# Patient Record
Sex: Female | Born: 1965 | Race: White | Hispanic: No | Smoking: Current every day smoker
Health system: Southern US, Community
[De-identification: ages and names within clinical notes are randomized; demographics above are authoritative.]

---

## 2001-08-09 ENCOUNTER — Encounter: Payer: Self-pay | Admitting: Internal Medicine

## 2001-08-09 ENCOUNTER — Ambulatory Visit (HOSPITAL_COMMUNITY): Admission: RE | Admit: 2001-08-09 | Discharge: 2001-08-09 | Payer: Self-pay | Admitting: Internal Medicine

## 2014-05-24 ENCOUNTER — Emergency Department (HOSPITAL_COMMUNITY): Payer: Managed Care, Other (non HMO)

## 2014-05-24 ENCOUNTER — Emergency Department (HOSPITAL_COMMUNITY)
Admission: EM | Admit: 2014-05-24 | Discharge: 2014-05-24 | Disposition: A | Discharge status: Home / Self Care | Payer: Managed Care, Other (non HMO) | Attending: Emergency Medicine | Admitting: Emergency Medicine

## 2014-05-24 ENCOUNTER — Encounter (HOSPITAL_COMMUNITY): Payer: Self-pay | Admitting: Emergency Medicine

## 2014-05-24 DIAGNOSIS — M542 Cervicalgia: Secondary | ICD-10-CM | POA: Insufficient documentation

## 2014-05-24 DIAGNOSIS — M5412 Radiculopathy, cervical region: Secondary | ICD-10-CM | POA: Insufficient documentation

## 2014-05-24 DIAGNOSIS — F411 Generalized anxiety disorder: Secondary | ICD-10-CM | POA: Insufficient documentation

## 2014-05-24 DIAGNOSIS — M25519 Pain in unspecified shoulder: Secondary | ICD-10-CM | POA: Insufficient documentation

## 2014-05-24 DIAGNOSIS — R000 Tachycardia, unspecified: Secondary | ICD-10-CM | POA: Insufficient documentation

## 2014-05-24 DIAGNOSIS — F17200 Nicotine dependence, unspecified, uncomplicated: Secondary | ICD-10-CM | POA: Insufficient documentation

## 2014-05-24 MED ORDER — IBUPROFEN 800 MG PO TABS
800.0000 mg | ORAL_TABLET | Freq: Once | ORAL | Status: AC
  Administered 2014-05-24: 800 mg via ORAL
  Filled 2014-05-24: qty 1

## 2014-05-24 MED ORDER — NAPROXEN 250 MG PO TABS
500.0000 mg | ORAL_TABLET | Freq: Once | ORAL | Status: AC
  Administered 2014-05-24: 500 mg via ORAL
  Filled 2014-05-24: qty 2

## 2014-05-24 MED ORDER — NAPROXEN SODIUM ER 500 MG PO TB24: 500.0000 mg | ORAL_TABLET | Freq: Every day | ORAL | Status: AC

## 2014-05-24 NOTE — ED Notes (Signed)
Pt here from home with c/o neck pain that  Radiates down the right arm , right arm has some numbness

## 2014-05-24 NOTE — ED Provider Notes (Signed)
CSN: [id number]     Arrival date & time 05/24/14  [id number] History   First MD Initiated Contact with Patient 05/24/14 [phone]     Chief Complaint  Patient presents with  . Neck Injury     (Consider location/radiation/quality/duration/timing/severity/associated sxs/prior Treatment) HPI Comments: Pt is a 48 y/o female who presents to the ED complaining of right shoulder pain radiating down her right arm with associated neck pain x 1 week, worsening over the past few days. Admits to associated tingling into her fingers. Pain worse with certain neck and shoulder movements. No alleviating factors. No known injury or trauma. States when she tries to hold things in her right hand it feels weaker than the left. Denies CP, sob, fever, chills.  Patient is a 48 y.o. female presenting with neck injury. The history is provided by the patient.  Neck Injury    History reviewed. No pertinent past medical history. History reviewed. No pertinent past surgical history. History reviewed. No pertinent family history. History  Substance Use Topics  . Smoking status: Current Every Day Smoker  . Smokeless tobacco: Not on file  . Alcohol Use: Yes   OB History   Grav Para Term Preterm Abortions TAB SAB Ect Mult Living                 Review of Systems  Musculoskeletal:       + right sided shoulder and neck pain.  Neurological:       + tingling sensation.  All other systems reviewed and are negative.     Allergies  Doxycycline  Home Medications   Prior to Admission medications   Medication Sig Start Date End Date Taking? Authorizing Provider  HYDROcodone-acetaminophen (NORCO/VICODIN) 5-325 MG per tablet Take 1 tablet by mouth once. For pain   Yes Historical Provider, MD  OVER THE COUNTER MEDICATION Take 1 tablet by mouth daily as needed (for indigestion). [facility] antiacid   Yes Historical Provider, MD  naproxen (NAPRELAN) 500 MG 24 hr tablet Take 1 tablet (500 mg total) by mouth daily with  breakfast. 05/24/14   Trevor Maceobyn M Albert, PA-C   BP 120/82  Pulse 112  Temp(Src) 98 F (36.7 C) (Oral)  Resp 18  SpO2 100% Physical Exam  Nursing note and vitals reviewed. Constitutional: She is oriented to person, place, and time. She appears well-developed and well-nourished. No distress.  HENT:  Head: Normocephalic and atraumatic.  Mouth/Throat: Oropharynx is clear and moist.  Eyes: Conjunctivae and EOM are normal. Pupils are equal, round, and reactive to light.  Neck: Normal range of motion. Neck supple. No spinous process tenderness and no muscular tenderness present.  Cardiovascular: Regular rhythm and normal heart sounds.   Tachy.  Pulmonary/Chest: Effort normal and breath sounds normal. No respiratory distress.  Musculoskeletal: She exhibits no edema.  TTP lower cervical spine and right sided paraspinal muscles. Full ROM, pain noted in all direction. TTP right trapezius. Right shoulder TTP throughout. No deformity or swelling. Full ROM, pain to right trapezius and right side of neck with flexion and abduction.  Neurological: She is alert and oriented to person, place, and time. She has normal strength.  Strength RUE 4/5 compared to 5/5 on right. Sensation intact. Normal gait.  Skin: Skin is warm and dry. No rash noted. She is not diaphoretic.  Psychiatric: Her behavior is normal.  Anxious.    ED Course  Procedures (including critical care time) Labs Review Labs Reviewed - No data to display  Imaging Review Dg Cervical  Spine Complete  05/24/2014   CLINICAL DATA:  Right neck pain, radiculopathy, neck stiffness  EXAM: CERVICAL SPINE  4+ VIEWS  COMPARISON:  None.  FINDINGS: There is no evidence of cervical spine fracture or prevertebral soft tissue swelling. Alignment is normal. Minor early degenerative change at C5-6 and C6-7. Preserved vertebral body heights and disc spaces. No focal kyphosis. Facets aligned.  IMPRESSION: No acute finding.   Electronically Signed   By: Ruel Favors M.D.   On: 05/24/2014 10:36     EKG Interpretation None      MDM   Final diagnoses:  Neck pain  Cervical radiculopathy   Pt presenting with reproducible neck pain and radicular symptoms. She is well appearing and in NAD. Afebrile. Tachycardic but appears anxious. Mild strength difference of RUE compared to LUE, otherwise no focal neuro deficits. No meningeal signs. D/c with NSAIDs, f/u with PCP when she returns home to [place]. She is in town visiting her family. Stable for d/c. Return precautions given. Patient states understanding of treatment care plan and is agreeable.  Trevor Mace, PA-C 05/24/14 1051

## 2014-05-24 NOTE — ED Notes (Signed)
Pt brought back to room by Molli HazardMatthew, EMT; myself and [place], RN present in room; pt undressed, in gown, on continuous pulse oximetry and blood pressure cuff

## 2014-05-24 NOTE — Discharge Instructions (Signed)
Take naproxen as directed for your pain. Follow up with your primary care doctor when you return home to [place].  Cervical Radiculopathy Cervical radiculopathy happens when a nerve in the neck is pinched or bruised by a slipped (herniated) disk or by arthritic changes in the bones of the cervical spine. This can occur due to an injury or as part of the normal aging process. Pressure on the cervical nerves can cause pain or numbness that runs from your neck all the way down into your arm and fingers. CAUSES  There are many possible causes, including:  Injury.  Muscle tightness in the neck from overuse.  Swollen, painful joints (arthritis).  Breakdown or degeneration in the bones and joints of the spine (spondylosis) due to aging.  Bone spurs that may develop near the cervical nerves. SYMPTOMS  Symptoms include pain, weakness, or numbness in the affected arm and hand. Pain can be severe or irritating. Symptoms may be worse when extending or turning the neck. DIAGNOSIS  Your caregiver will ask about your symptoms and do a physical exam. He or she may test your strength and reflexes. X-rays, CT scans, and MRI scans may be needed in cases of injury or if the symptoms do not go away after a period of time. Electromyography (EMG) or nerve conduction testing may be done to study how your nerves and muscles are working. TREATMENT  Your caregiver may recommend certain exercises to help relieve your symptoms. Cervical radiculopathy can, and often does, get better with time and treatment. If your problems continue, treatment options may include:  Wearing a soft collar for short periods of time.  Physical therapy to strengthen the neck muscles.  Medicines, such as nonsteroidal anti-inflammatory drugs (NSAIDs), oral corticosteroids, or spinal injections.  Surgery. Different types of surgery may be done depending on the cause of your problems. HOME CARE INSTRUCTIONS   Put ice on the affected  area.  Put ice in a plastic bag.  Place a towel between your skin and the bag.  Leave the ice on for 15-20 minutes, 03-04 times a day or as directed by your caregiver.  If ice does not help, you can try using heat. Take a warm shower or bath, or use a hot water bottle as directed by your caregiver.  You may try a gentle neck and shoulder massage.  Use a flat pillow when you sleep.  Only take over-the-counter or prescription medicines for pain, discomfort, or fever as directed by your caregiver.  If physical therapy was prescribed, follow your caregiver's directions.  If a soft collar was prescribed, use it as directed. SEEK IMMEDIATE MEDICAL CARE IF:   Your pain gets much worse and cannot be controlled with medicines.  You have weakness or numbness in your hand, arm, face, or leg.  You have a high fever or a stiff, rigid neck.  You lose bowel or bladder control (incontinence).  You have trouble with walking, balance, or speaking. MAKE SURE YOU:   Understand these instructions.  Will watch your condition.  Will get help right away if you are not doing well or get worse. Document Released: 07/13/2001 Document Revised: 01/10/2012 Document Reviewed: 06/01/2011 [facility] Patient Information 2015 [employer], [place]. This information is not intended to replace advice given to you by your health care provider. Make sure you discuss any questions you have with your health care provider.

## 2014-05-25 NOTE — ED Provider Notes (Signed)
Medical screening examination/treatment/procedure(s) were performed by non-physician practitioner and as supervising physician I was immediately available for consultation/collaboration.   EKG Interpretation None        Hind Chesler W Tatayana Beshears, MD 05/25/14 1537 

## 2015-10-02 IMAGING — CR DG CERVICAL SPINE COMPLETE 4+V
6 series · 6 of 6 positions shown · non-contrast
Comparison: None.

CLINICAL DATA: Right neck pain, radiculopathy, neck stiffness

EXAM:
CERVICAL SPINE  4+ VIEWS

[w cervical spine lat]
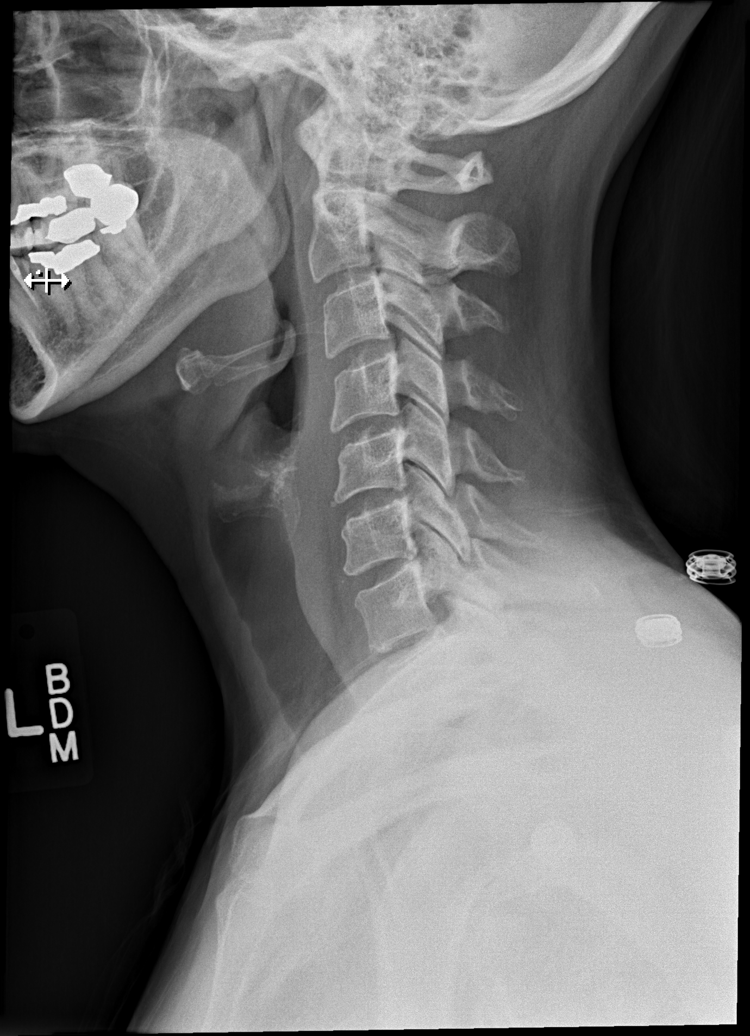

[w cervical spine ap_obl (1 of 2)]
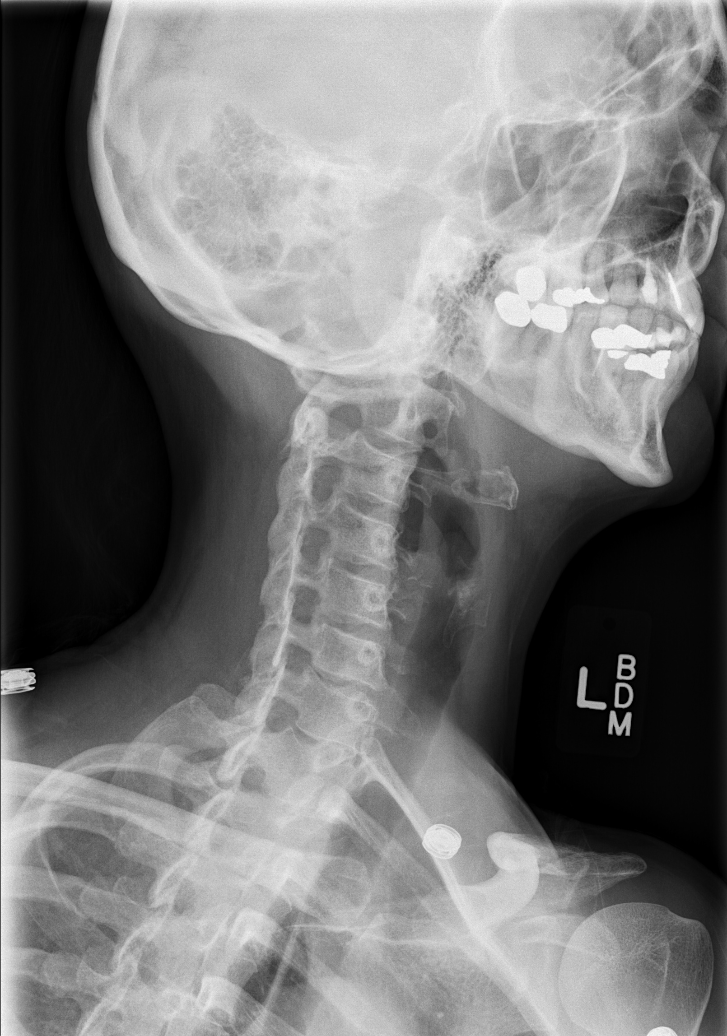

[w cervical spine ap_obl (2 of 2)]
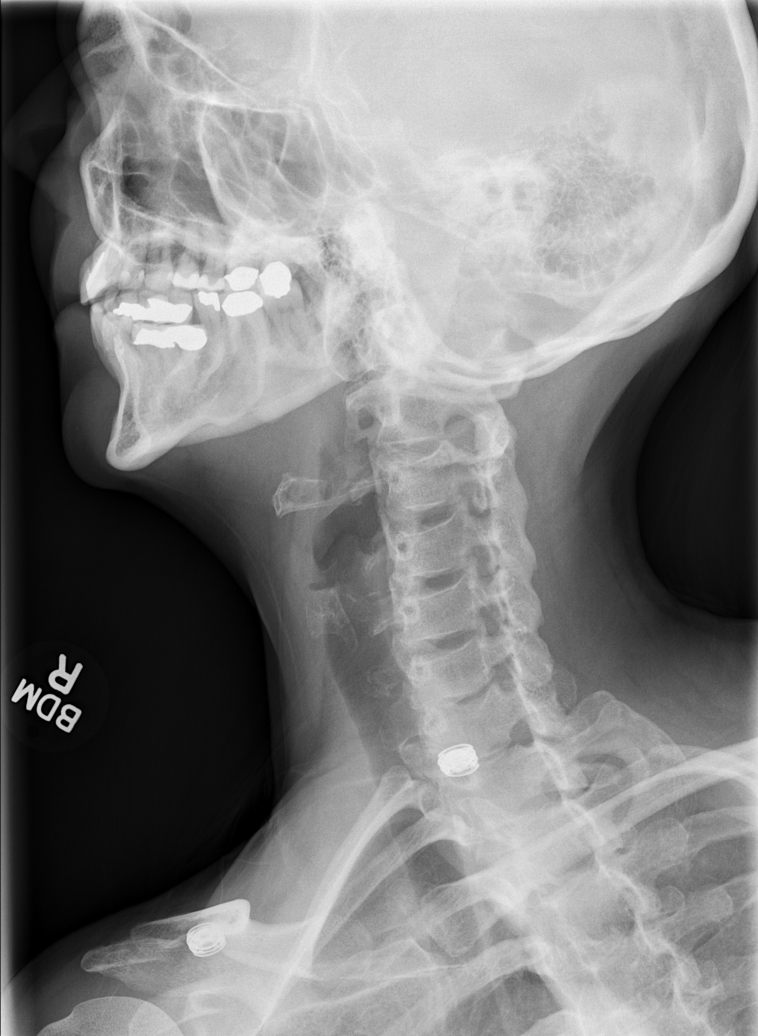

[w cervical spine ap]
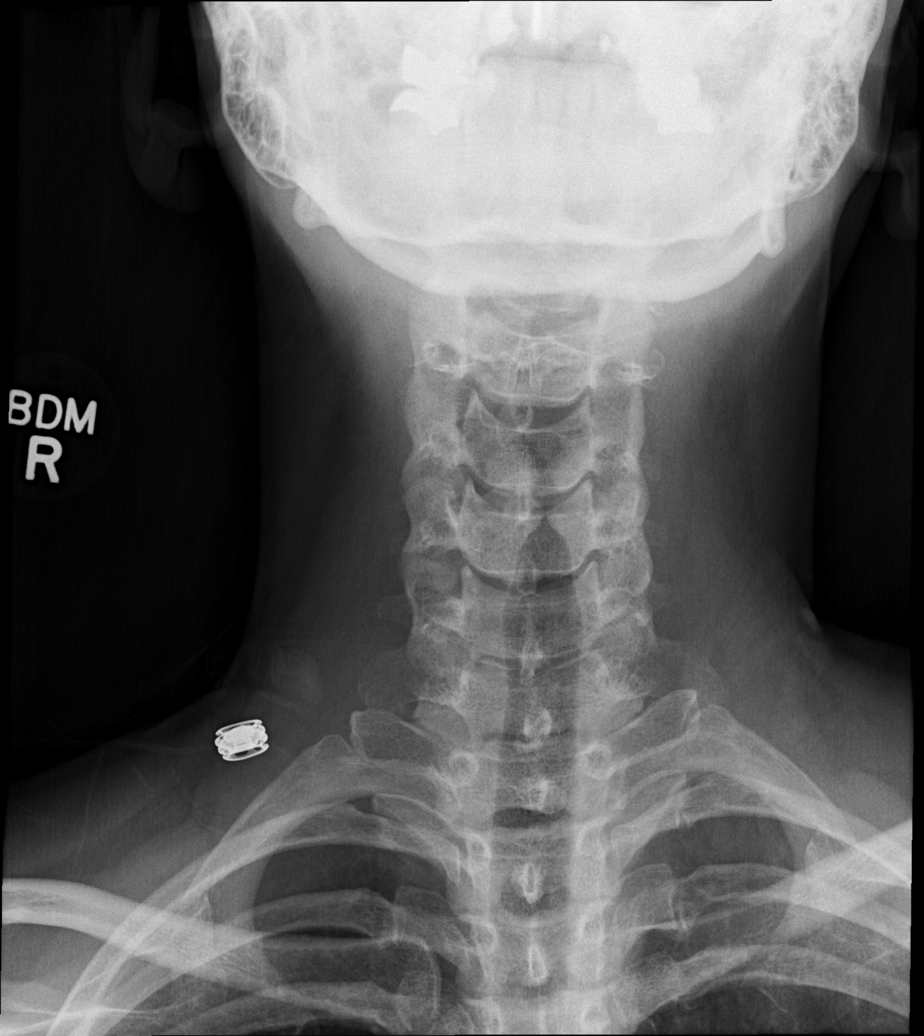

[w cervical spine odontoid (1 of 2)]
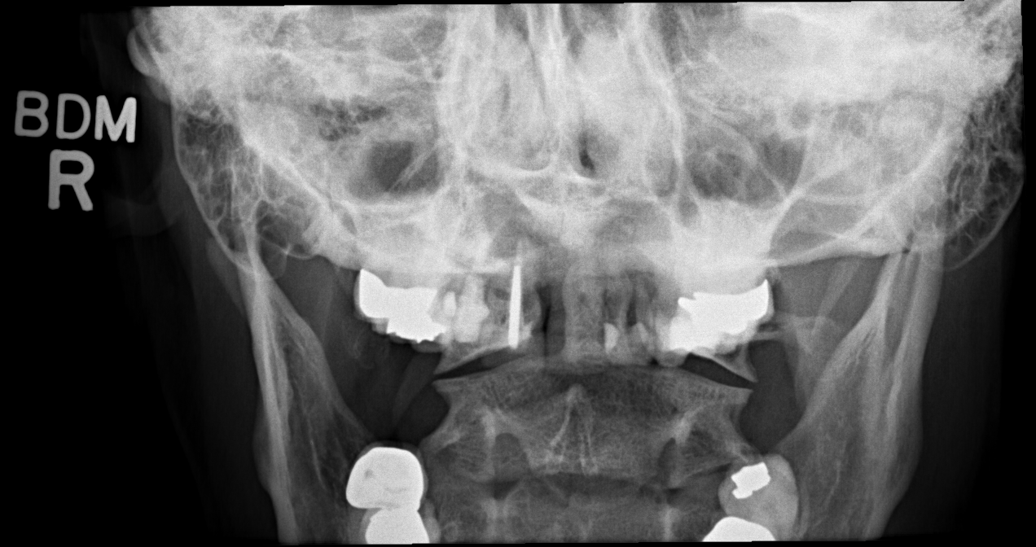

[w cervical spine odontoid (2 of 2)]
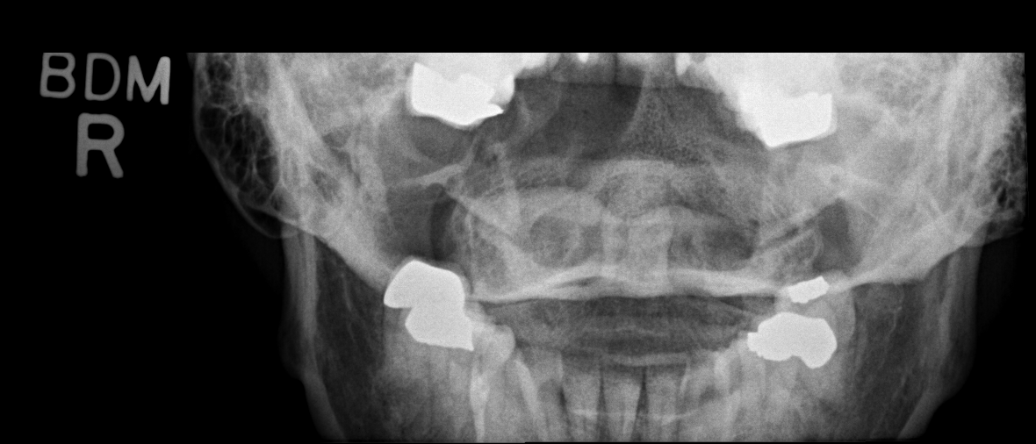

[6 of 6 positions shown; findings below may reference images not displayed]

FINDINGS: There is no evidence of cervical spine fracture or prevertebral soft
tissue swelling. Alignment is normal. Minor early degenerative
change at C5-6 and C6-7. Preserved vertebral body heights and disc
spaces. No focal kyphosis. Facets aligned.
IMPRESSION: No acute finding.
